# Patient Record
Sex: Female | Born: 1955 | Race: White | Hispanic: No | Smoking: Never smoker
Health system: Southern US, Community
[De-identification: ages and names within clinical notes are randomized; demographics above are authoritative.]

## PROBLEM LIST (undated history)

## (undated) DIAGNOSIS — E7800 Pure hypercholesterolemia, unspecified: Secondary | ICD-10-CM

## (undated) HISTORY — PX: BREAST ENHANCEMENT SURGERY: SHX7

## (undated) HISTORY — PX: TUBAL LIGATION: SHX77

---

## 2014-12-30 ENCOUNTER — Encounter (HOSPITAL_COMMUNITY): Payer: Self-pay | Admitting: Emergency Medicine

## 2014-12-30 ENCOUNTER — Emergency Department (HOSPITAL_COMMUNITY): Payer: BLUE CROSS/BLUE SHIELD

## 2014-12-30 ENCOUNTER — Emergency Department (HOSPITAL_COMMUNITY)
Admission: EM | Admit: 2014-12-30 | Discharge: 2014-12-31 | Disposition: A | Discharge status: Home / Self Care | Payer: BLUE CROSS/BLUE SHIELD | Attending: Emergency Medicine | Admitting: Emergency Medicine

## 2014-12-30 DIAGNOSIS — R079 Chest pain, unspecified: Secondary | ICD-10-CM | POA: Diagnosis present

## 2014-12-30 DIAGNOSIS — Z8639 Personal history of other endocrine, nutritional and metabolic disease: Secondary | ICD-10-CM | POA: Diagnosis not present

## 2014-12-30 HISTORY — DX: Pure hypercholesterolemia, unspecified: E78.00

## 2014-12-30 LAB — CBC WITH DIFFERENTIAL/PLATELET
BASOS PCT: 0 % (ref 0–1)
Basophils Absolute: 0 10*3/uL (ref 0.0–0.1)
EOS PCT: 2 % (ref 0–5)
Eosinophils Absolute: 0.1 10*3/uL (ref 0.0–0.7)
HEMATOCRIT: 43.1 % (ref 36.0–46.0)
HEMOGLOBIN: 14.1 g/dL (ref 12.0–15.0)
LYMPHS PCT: 30 % (ref 12–46)
Lymphs Abs: 1.7 10*3/uL (ref 0.7–4.0)
MCH: 28.7 pg (ref 26.0–34.0)
MCHC: 32.7 g/dL (ref 30.0–36.0)
MCV: 87.8 fL (ref 78.0–100.0)
Monocytes Absolute: 0.5 10*3/uL (ref 0.1–1.0)
Monocytes Relative: 9 % (ref 3–12)
Neutro Abs: 3.3 10*3/uL (ref 1.7–7.7)
Neutrophils Relative %: 59 % (ref 43–77)
Platelets: 339 10*3/uL (ref 150–400)
RBC: 4.91 MIL/uL (ref 3.87–5.11)
RDW: 12.8 % (ref 11.5–15.5)
WBC: 5.6 10*3/uL (ref 4.0–10.5)

## 2014-12-30 LAB — BASIC METABOLIC PANEL
Anion gap: 10 (ref 5–15)
BUN: 13 mg/dL (ref 6–23)
CHLORIDE: 101 mmol/L (ref 96–112)
CO2: 27 mmol/L (ref 19–32)
Calcium: 9.4 mg/dL (ref 8.4–10.5)
Creatinine, Ser: 0.84 mg/dL (ref 0.50–1.10)
GFR, EST AFRICAN AMERICAN: 88 mL/min — AB (ref >90)
GFR, EST NON AFRICAN AMERICAN: 76 mL/min — AB (ref >90)
Glucose, Bld: 122 mg/dL — ABNORMAL HIGH (ref 70–99)
POTASSIUM: 3.8 mmol/L (ref 3.5–5.1)
Sodium: 138 mmol/L (ref 135–145)

## 2014-12-30 LAB — BRAIN NATRIURETIC PEPTIDE: B Natriuretic Peptide: 9 pg/mL (ref 0.0–100.0)

## 2014-12-30 LAB — I-STAT TROPONIN, ED
TROPONIN I, POC: 0 ng/mL (ref 0.00–0.08)
TROPONIN I, POC: 0 ng/mL (ref 0.00–0.08)

## 2014-12-30 NOTE — ED Notes (Signed)
[facility] EMS[facility] and SOB; pt denies pain at present; pt sts started 3 days ago; pt given 81mg  ASA; IV 18 L AC

## 2014-12-31 NOTE — ED Provider Notes (Signed)
CSN: [id number]     Arrival date & time 12/30/14  1701 History   First MD Initiated Contact with Patient 12/30/14 2211     Chief Complaint  Patient presents with  . Chest Pain     (Consider location/radiation/quality/duration/timing/severity/associated sxs/prior Treatment) Patient is a 59 y.o. female presenting with chest pain. The history is provided by the patient.  Chest Pain Pain location:  Substernal area Pain quality: pressure   Pain radiates to:  Does not radiate Pain radiates to the back: no   Pain severity:  Mild Onset quality:  Gradual Duration:  3 days Timing:  Intermittent Progression:  Waxing and waning Chronicity:  New Context: not breathing, no intercourse, not raising an arm, no stress and no trauma   Relieved by:  None tried Worsened by:  Nothing tried Ineffective treatments:  None tried Associated symptoms: no abdominal pain, no anxiety, no cough, no diaphoresis, no dizziness, no fever, no headache, no nausea, no shortness of breath, not vomiting and no weakness   Risk factors: high cholesterol   Risk factors: no diabetes mellitus, no hypertension, no prior DVT/PE, no smoking and no surgery     Past Medical History  Diagnosis Date  . Hypercholesteremia    History reviewed. No pertinent past surgical history. History reviewed. No pertinent family history. History  Substance Use Topics  . Smoking status: Never Smoker   . Smokeless tobacco: Not on file  . Alcohol Use: No   OB History    No data available     Review of Systems  Constitutional: Negative for fever, chills and diaphoresis.  HENT: Negative for nosebleeds.   Eyes: Negative for visual disturbance.  Respiratory: Negative for cough and shortness of breath.   Cardiovascular: Positive for chest pain.  Gastrointestinal: Negative for nausea, vomiting, abdominal pain, diarrhea and constipation.  Genitourinary: Negative for dysuria.  Skin: Negative for rash.  Neurological: Negative for  dizziness, weakness and headaches.  All other systems reviewed and are negative.     Allergies  Erythromycin and Peach flavor  Home Medications   Prior to Admission medications   Not on File   BP 122/57 mmHg  Pulse 93  Temp(Src) 98.1 F (36.7 C) (Oral)  Resp 13  SpO2 99% Physical Exam  Constitutional: She is oriented to person, place, and time. No distress.  HENT:  Head: Normocephalic and atraumatic.  Eyes: EOM are normal. Pupils are equal, round, and reactive to light.  Neck: Normal range of motion. Neck supple.  Cardiovascular: Normal rate and intact distal pulses.   Pulmonary/Chest: No respiratory distress.  Abdominal: Soft. There is no tenderness.  Musculoskeletal: Normal range of motion.  Neurological: She is alert and oriented to person, place, and time.  Skin: No rash noted. She is not diaphoretic.  Psychiatric: She has a normal mood and affect.    ED Course  Procedures (including critical care time) Labs Review Labs Reviewed  BASIC METABOLIC PANEL - Abnormal; Notable for the following:    Glucose, Bld 122 (*)    GFR calc non Af Amer 76 (*)    GFR calc Af Amer 88 (*)    All other components within normal limits  BRAIN NATRIURETIC PEPTIDE  CBC WITH DIFFERENTIAL/PLATELET  Rosezena Sensor, ED  Rosezena Sensor, ED    Imaging Review Dg Chest 2 View  12/30/2014   CLINICAL DATA:  Initial evaluation for chest pain for 1 day  EXAM: CHEST  2 VIEW  COMPARISON:  None.  FINDINGS: The heart size and  mediastinal contours are within normal limits. Both lungs are clear. The visualized skeletal structures are unremarkable.  IMPRESSION: No active cardiopulmonary disease.   Electronically Signed   By: Esperanza Heiraymond  Rubner M.D.   On: 12/30/2014 17:40     EKG Interpretation   Date/Time:  Monday December 30 2014 17:10:29 EDT Ventricular Rate:  93 PR Interval:  124 QRS Duration: 82 QT Interval:  352 QTC Calculation: 437 R Axis:   54 Text Interpretation:  Normal sinus rhythm  Cannot rule out Anterior infarct  , age undetermined Abnormal ECG No old tracing to compare Confirmed by  CAMPOS  MD, Caryn BeeKEVIN ([id number]) on 12/30/2014 11:24:39 PM      MDM   Final diagnoses:  None    59 y/o female w/ PMH HL who comes in with three days of intermittent, mild substernal chest discomfort which she describes as "heaviness."  The pain will last for hours at at time but is very mild and she has difficulty determining when it starts/stops exactly because of how mild it is.  She notes that she has been under a lot of emotional stress recently as she recalls her husbands passing but she denies any SI.  No cough/SOB/leg swelling, no prev dvt/pe.  No previous ischemic evaluation  Exam as above, VSS, satting well on RA.  Lungs CTAB  EKG w/o ischemic changes.  Basic labs obtained and unremarkable, trop neg X2.  Patient w/ HEAR score of 3, feel that ACS is unlikely given few risk factors and undetectable trop X2, but feel that patient would benefit from provocative testing.  Given she has a HEART score of 3, feel it is appropriate for this to be done as an outpatient.  Doubt PE given no leg swelling, no prev dvt/pe.  Not tachy, satting 100% on RA.  Doubt dissection given characteristics of the pain.  I have discussed the results, Dx and Tx plan with the patient. They expressed understanding and agree with the plan and were told to return to ED with any worsening of condition or concern.    Disposition: Discharge  Condition: Good  There are no discharge medications for this patient.   [facility] [address] [id number] [place] [id number] [phone]  If symptoms worsen   Pt seen in conjunction with Dr. Janice Coffincampos     Porcia Morganti, MD 01/01/15 [id number]  Azalia BilisKevin Campos, MD 01/01/15 [phone]

## 2014-12-31 NOTE — Discharge Instructions (Signed)

## 2015-10-29 IMAGING — CR DG CHEST 2V
2 series · 2 of 2 positions shown · non-contrast
Comparison: None.

CLINICAL DATA: Initial evaluation for chest pain for 1 day

EXAM:
CHEST  2 VIEW

[w chest pa]
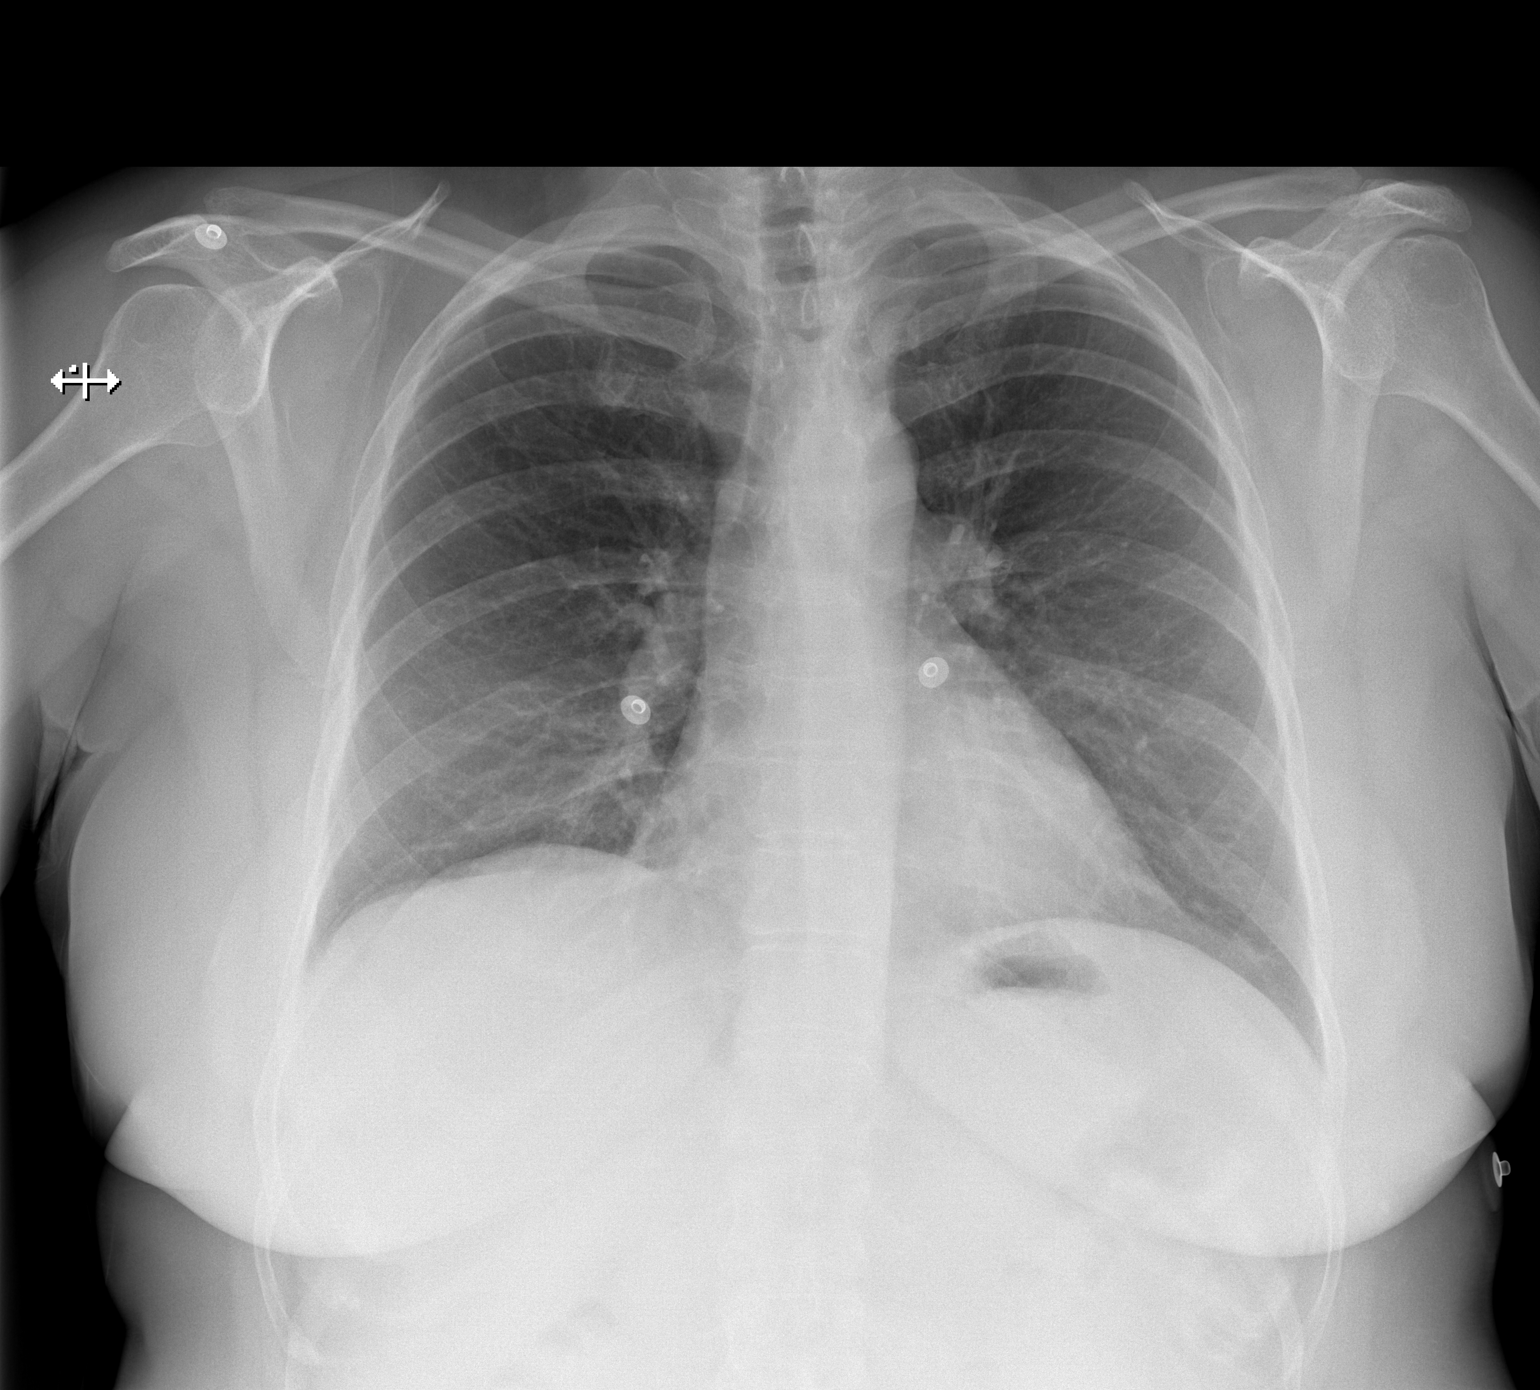

[w chest lat]
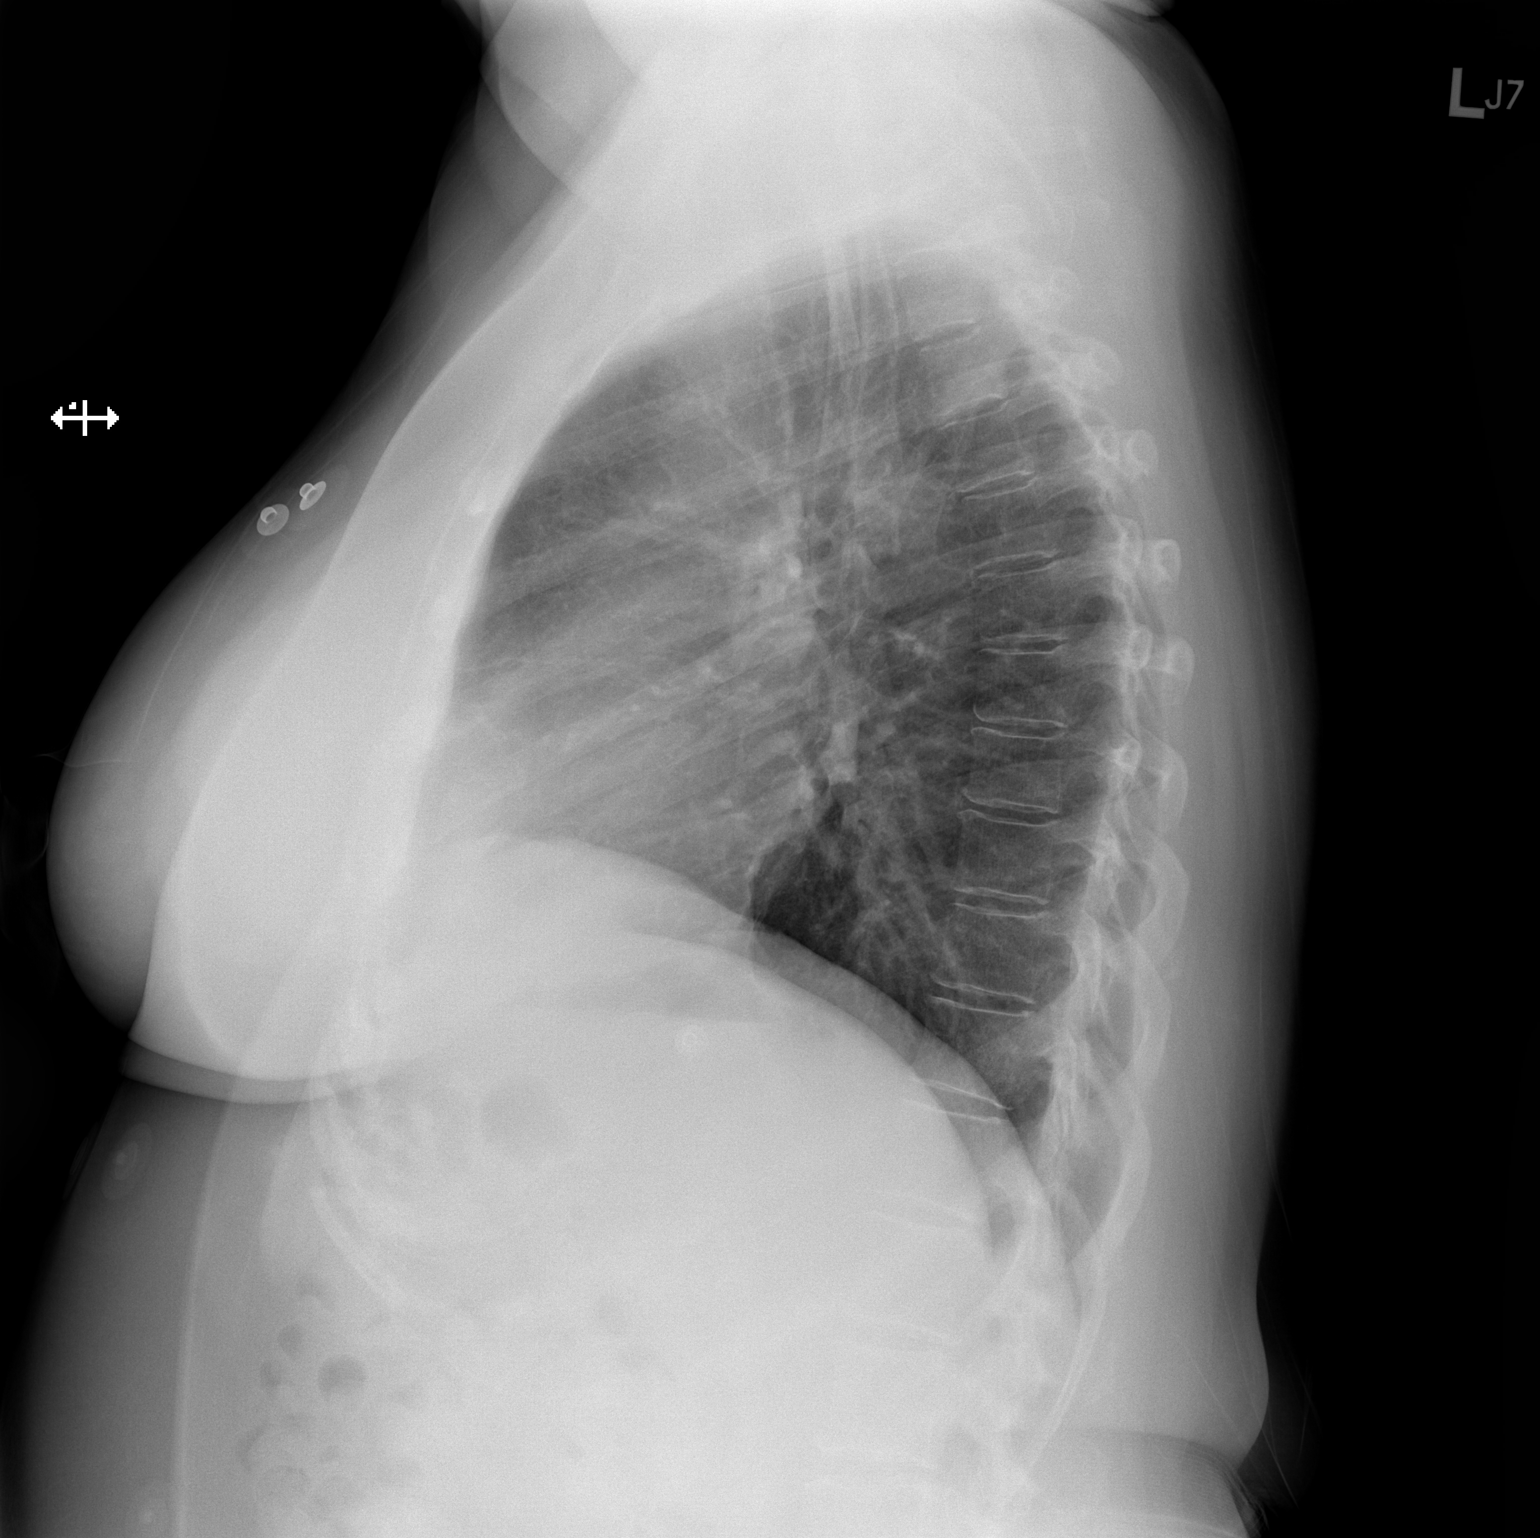

[2 of 2 positions shown; findings below may reference images not displayed]

FINDINGS: The heart size and mediastinal contours are within normal limits.
Both lungs are clear. The visualized skeletal structures are
unremarkable.
IMPRESSION: No active cardiopulmonary disease.

## 2015-12-10 ENCOUNTER — Ambulatory Visit (INDEPENDENT_AMBULATORY_CARE_PROVIDER_SITE_OTHER): Payer: BLUE CROSS/BLUE SHIELD | Admitting: Osteopathic Medicine

## 2015-12-10 ENCOUNTER — Encounter: Payer: Self-pay | Admitting: Osteopathic Medicine

## 2015-12-10 VITALS — BP 152/89 | HR 93 | Ht 61.0 in | Wt 208.0 lb

## 2015-12-10 DIAGNOSIS — IMO0001 Reserved for inherently not codable concepts without codable children: Secondary | ICD-10-CM

## 2015-12-10 DIAGNOSIS — R0789 Other chest pain: Secondary | ICD-10-CM

## 2015-12-10 DIAGNOSIS — R768 Other specified abnormal immunological findings in serum: Secondary | ICD-10-CM

## 2015-12-10 DIAGNOSIS — R030 Elevated blood-pressure reading, without diagnosis of hypertension: Secondary | ICD-10-CM

## 2015-12-10 DIAGNOSIS — R5382 Chronic fatigue, unspecified: Secondary | ICD-10-CM | POA: Insufficient documentation

## 2015-12-10 DIAGNOSIS — R21 Rash and other nonspecific skin eruption: Secondary | ICD-10-CM

## 2015-12-10 DIAGNOSIS — E559 Vitamin D deficiency, unspecified: Secondary | ICD-10-CM

## 2015-12-10 DIAGNOSIS — R894 Abnormal immunological findings in specimens from other organs, systems and tissues: Secondary | ICD-10-CM

## 2015-12-10 DIAGNOSIS — Z139 Encounter for screening, unspecified: Secondary | ICD-10-CM | POA: Diagnosis not present

## 2015-12-10 DIAGNOSIS — Z13220 Encounter for screening for lipoid disorders: Secondary | ICD-10-CM | POA: Diagnosis not present

## 2015-12-10 NOTE — Progress Notes (Signed)
HPI: Madison Sweeney is a 60 y.o. female who presents to [facility] today for chief complaint of:  Chief Complaint  Patient presents with  . Establish Care    "general not feeling well, tired, have to sit down, chest tightness usually at nigh"    CHEST PAIN  . Location: Left sternal . Quality: pressure, "like you get when you have a cold,"  . Severity: Mild . Duration: Few months, no change . Timing: Intermittent not associated with exertion, seems to wake her up mostly in the middle of the night . Context: previously w/u for chest pain last year in ER, not admitted  . Assoc signs/symptoms: no SOB/orthopnea  FATIGUE: "not feeling well, just tired" and occasional SOB during the day but she only has chest pain night, not associated with daytime shortness of breath.   RESP: (+) hx secondhand smoke exposure, no active smoker.   SKIN: Eczema, would like refill of topical steroid. Patient reports rash, worse in the summer months, located under breasts and lower abdomen   Past medical, social and family history reviewed: Past Medical History  Diagnosis Date  . Hypercholesteremia    Past Surgical History  Procedure Laterality Date  . Breast enhancement surgery    . Tubal ligation     Social History  Substance Use Topics  . Smoking status: Never Smoker   . Smokeless tobacco: Not on file  . Alcohol Use: No   Family History  Problem Relation Age of Onset  . Heart attack Mother     DECEASED  . Heart attack Maternal Grandfather   . Diabetes Father     DECEASED    Current Outpatient Prescriptions  Medication Sig Dispense Refill  . B Complex-C-E-Zn (B COMPLEX-C-E-ZINC) tablet Take 1 tablet by mouth daily.     No current facility-administered medications for this visit.   Allergies  Allergen Reactions  . Erythromycin Anaphylaxis  . Peach Flavor Rash      Review of Systems: CONSTITUTIONAL:  No  fever, no chills, No  unintentional weight  changes HEAD/EYES/EARS/NOSE/THROAT: No  headache, no vision change, no hearing change, No  sore throat, No  sinus pressure CARDIAC: (+) chest pain as per history of present illness, No  pressure, No palpitations, No  orthopnea RESPIRATORY: No  cough, (+) Asian old daytime shortness of breath GASTROINTESTINAL: No  nausea, No  vomiting, No  abdominal pain, No  blood in stool, No  diarrhea, No  constipation  MUSCULOSKELETAL: No  myalgia/arthralgia GENITOURINARY: No  incontinence, No  abnormal genital bleeding/discharge SKIN: (+)  Rash as per history of present illness, no wounds/concerning lesions HEM/ONC: No  easy bruising/bleeding, No  abnormal lymph node ENDOCRINE: No polyuria/polydipsia/polyphagia, No  heat/cold intolerance  NEUROLOGIC: (+) Generalized weakness, No  dizziness, No  slurred speech PSYCHIATRIC: No  concerns with depression, No  concerns with anxiety, No sleep problems   Exam:  BP 152/89 mmHg  Pulse 93  Ht 5' 1" (1.549 m)  Wt 208 lb (94.348 kg)  BMI 39.32 kg/m2 Constitutional: VS see above. General Appearance: alert, well-developed, well-nourished, NAD Eyes: Normal lids and conjunctive, non-icteric sclera, Ears, Nose, Mouth, Throat: MMM, Normal external inspection ears/nares/mouth/lips/gums, TM normal bilaterally. Pharynx no erythema, no exudate.  Neck: No masses, trachea midline. No thyroid enlargement/tenderness/mass appreciated. No lymphadenopathy Respiratory: Normal respiratory effort. no wheeze, no rhonchi, no rales Cardiovascular: S1/S2 normal, no murmur, no rub/gallop auscultated. RRR. No lower extremity edema. Gastrointestinal: Nontender, no masses. No hepatomegaly, no splenomegaly. No hernia  appreciated. Bowel sounds normal. Rectal exam deferred.  Musculoskeletal: Gait normal. No clubbing/cyanosis of digits.  Neurological: No cranial nerve deficit on limited exam. Motor and sensation intact and symmetric Skin: warm, dry, intact. No rash/ulcer. No concerning nevi or  subq nodules on limited exam.   Psychiatric: Normal judgment/insight. Normal mood and affect. Oriented x3.   EKG interpretation: Rate: 82  Rhythm: sinus No ST/T changes concerning for acute ischemia/infarct  Inverted T waves V1    Results for orders placed or performed in visit on 12/10/15 (from the past 72 hour(s))  CBC with Differential/Platelet     Status: None   Collection Time: 12/10/15  8:27 AM  Result Value Ref Range   WBC 4.1 4.0 - 10.5 K/uL   RBC 4.93 3.87 - 5.11 MIL/uL   Hemoglobin 14.2 12.0 - 15.0 g/dL   HCT 43.0 36.0 - 46.0 %   MCV 87.2 78.0 - 100.0 fL   MCH 28.8 26.0 - 34.0 pg   MCHC 33.0 30.0 - 36.0 g/dL   RDW 13.5 11.5 - 15.5 %   Platelets 331 150 - 400 K/uL   MPV 10.8 8.6 - 12.4 fL   Neutrophils Relative % 58 43 - 77 %   Neutro Abs 2.4 1.7 - 7.7 K/uL   Lymphocytes Relative 28 12 - 46 %   Lymphs Abs 1.1 0.7 - 4.0 K/uL   Monocytes Relative 12 3 - 12 %   Monocytes Absolute 0.5 0.1 - 1.0 K/uL   Eosinophils Relative 2 0 - 5 %   Eosinophils Absolute 0.1 0.0 - 0.7 K/uL   Basophils Relative 0 0 - 1 %   Basophils Absolute 0.0 0.0 - 0.1 K/uL   Smear Review Criteria for review not met   COMPLETE METABOLIC PANEL WITH GFR     Status: None   Collection Time: 12/10/15  8:27 AM  Result Value Ref Range   Sodium 139 135 - 146 mmol/L   Potassium 4.4 3.5 - 5.3 mmol/L   Chloride 104 98 - 110 mmol/L   CO2 26 20 - 31 mmol/L   Glucose, Bld 97 65 - 99 mg/dL   BUN 16 7 - 25 mg/dL   Creat 0.81 0.50 - 1.05 mg/dL   Total Bilirubin 0.5 0.2 - 1.2 mg/dL   Alkaline Phosphatase 63 33 - 130 U/L   AST 16 10 - 35 U/L   ALT 14 6 - 29 U/L   Total Protein 7.1 6.1 - 8.1 g/dL   Albumin 4.3 3.6 - 5.1 g/dL   Calcium 9.4 8.6 - 10.4 mg/dL   GFR, Est African American >89 >=60 mL/min   GFR, Est Non African American 81 >=60 mL/min    Comment:   The estimated GFR is a calculation valid for adults (>=2 years old) that uses the CKD-EPI algorithm to adjust for age and sex. It is   not to be  used for children, pregnant women, hospitalized patients,    patients on dialysis, or with rapidly changing kidney function. According to the NKDEP, eGFR >89 is normal, 60-89 shows mild impairment, 30-59 shows moderate impairment, 15-29 shows severe impairment and <15 is ESRD.     HIV antibody     Status: None   Collection Time: 12/10/15  8:27 AM  Result Value Ref Range   HIV 1&2 Ab, 4th Generation NONREACTIVE NONREACTIVE    Comment:   HIV-1 antigen and HIV-1/HIV-2 antibodies were not detected.  There is no laboratory evidence of HIV infection.   HIV-1/2  Antibody Diff        Not indicated. HIV-1 RNA, Qual TMA          Not indicated.     PLEASE NOTE: This information has been disclosed to you from records whose confidentiality may be protected by state law. If your state requires such protection, then the state law prohibits you from making any further disclosure of the information without the specific written consent of the person to whom it pertains, or as otherwise permitted by law. A general authorization for the release of medical or other information is NOT sufficient for this purpose.   The performance of this assay has not been clinically validated in patients less than 57 years old.   For additional information please refer to http://education.questdiagnostics.com/faq/FAQ106.  (This link is being provided for informational/educational purposes only.)     TSH     Status: None   Collection Time: 12/10/15  8:27 AM  Result Value Ref Range   TSH 4.35 mIU/L    Comment:   Reference Range   > or = 20 Years  0.40-4.50   Pregnancy Range First trimester  0.26-2.66 Second trimester 0.55-2.73 Third trimester  0.43-2.91   ** Please note change in unit of measure and reference range(s). **     VITAMIN D 25 Hydroxy (Vit-D Deficiency, Fractures)     Status: Abnormal   Collection Time: 12/10/15  8:27 AM  Result Value Ref Range   Vit D, 25-Hydroxy 10 (L) 30 - 100 ng/mL     Comment: Vitamin D Status           25-OH Vitamin D        Deficiency                <20 ng/mL        Insufficiency         20 - 29 ng/mL        Optimal             > or = 30 ng/mL   For 25-OH Vitamin D testing on patients on D2-supplementation and patients for whom quantitation of D2 and D3 fractions is required, the [facility] 25-OH VIT D, (D2,D3), LC/MS/MS is recommended: order code [phone] (patients > 2 yrs).   Lipid panel     Status: Abnormal   Collection Time: 12/10/15  8:27 AM  Result Value Ref Range   Cholesterol 264 (H) 125 - 200 mg/dL   Triglycerides 224 (H) <150 mg/dL   HDL 50 >=46 mg/dL   Total CHOL/HDL Ratio 5.3 (H) <=5.0 Ratio   VLDL 45 (H) <30 mg/dL   LDL Cholesterol 169 (H) <130 mg/dL    Comment:   Total Cholesterol/HDL Ratio:CHD Risk                        Coronary Heart Disease Risk Table                                        Men       Women          1/2 Average Risk              3.4        3.3              Average Risk  5.0        4.4           2X Average Risk              9.6        7.1           3X Average Risk             23.4       11.0 Use the calculated Patient Ratio above and the CHD Risk table  to determine the patient's CHD Risk.   Hepatitis C antibody     Status: Abnormal   Collection Time: 12/10/15  8:27 AM  Result Value Ref Range   HCV Ab REACTIVE (A) NEGATIVE  Hepatitis C RNA quantitative     Status: None (Preliminary result)   Collection Time: 12/10/15  8:27 AM  Result Value Ref Range   HCV Quantitative  <15 IU/mL   HCV Quantitative Log  <1.18 log 10    Comment:   This test utilizes the [place] FDA approved Roche HCV Test Kit by RT-PCR.       ASSESSMENT/PLAN:   Chronic fatigue - Poor diet, very little exercise, pt trying to improve diet, (+)FH thyroid problems, labs as below - Plan: CBC with Differential/Platelet, COMPLETE METABOLIC PANEL WITH GFR, TSH, VITAMIN D 25 Hydroxy (Vit-D Deficiency, Fractures) - extensively counseled  on diet/exercise changes to improve overall health and reduce cardiovascular risk  Chest discomfort - usually at night, "pressure" feeling, no SOB/diaphoresis, no CP on exertion. EKG today. Previous notes indicate nonspecifc abn EKG.  - Plan: EKG 12-Lead. No exertional symptoms, will get labs, possible consideration for sleep apnea, we'll pursue this further at next visit, ER precautions reviewed. Blood pressure elevated, patient states has whitecoat hypertension. On record review, blood pressure 140/90 12/30/2014, 143/87 on 12/01/2013  Lipid screening - Plan: Lipid panel  Screening - Plan: HIV antibody, Hepatitis C antibody  Rash and nonspecific skin eruption - Within skin folds, unlikely eczema, pt states worse in summer, would probably not advise steroids for this, consider nystatin powder, we'll call this in to use as needed  Elevated blood pressure - ?white coat syndrome - pt to bring home monitor to confirm.   Hepatitis C antibody positive awaiting confirmatory lab   Hypovitaminosis D  Return in about 1 week (around 12/17/2015), or sooner if needed, for [place].

## 2015-12-10 NOTE — Patient Instructions (Signed)
Please come back to get fasting lab work done for accurate cholesterol measurements and sugar measurements. We are getting other tests for routine preventive care screening as well as work-up for chronic fatigue. You can expect a call to inform you of results, and we can discuss results in detail at your next office visit.   See below for heart-healthy eating plan. If you have a smartphone, there are several apps out there which help you track your calories and will measure your daily intake of fat versus carbs versus protein. My Fitness Pal app will even scan bar codes on foods and has information on [employer] nutrition information. Quit McDonald's!!!!   If chest pain is worse, is not going away, is accompanied by palpitations or sweating, or severe dizziness, or if you have any other concerns - GO TO EMERGENCY ROOM!    Heart-Healthy Eating Plan Many factors influence your heart health, including eating and exercise habits. Heart (coronary) risk increases with abnormal blood fat (lipid) levels. Heart-healthy meal planning includes limiting unhealthy fats, increasing healthy fats, and making other small dietary changes. This includes maintaining a healthy body weight to help keep lipid levels within a normal range. WHAT IS MY PLAN?  Your health care provider recommends that you:  Get no more than _____30____% of the total calories in your daily diet from fat.  Limit your intake of saturated fat to less than ____16_____ grams each day.  Limit the amount of cholesterol in your diet to less than ___200-300____ mg per day. WHAT TYPES OF FAT SHOULD I CHOOSE?  Choose healthy fats more often. Choose monounsaturated and polyunsaturated fats, such as olive oil and canola oil, flaxseeds, walnuts, almonds, and seeds.  Eat more omega-3 fats. Good choices include salmon, mackerel, sardines, tuna, flaxseed oil, and ground flaxseeds. Aim to eat fish at least two times each week.  Limit saturated  fats. Saturated fats are primarily found in animal products, such as meats, butter, and cream. Plant sources of saturated fats include palm oil, palm kernel oil, and coconut oil.  Avoid foods with partially hydrogenated oils in them. These contain trans fats. Examples of foods that contain trans fats are stick margarine, some tub margarines, cookies, crackers, and other baked goods. WHAT GENERAL GUIDELINES DO I NEED TO FOLLOW?  Check food labels carefully to identify foods with trans fats or high amounts of saturated fat.  Fill one half of your plate with vegetables and green salads. Eat 4-5 servings of vegetables per day. A serving of vegetables equals 1 cup of raw leafy vegetables,  cup of raw or cooked cut-up vegetables, or  cup of vegetable juice.  Fill one fourth of your plate with whole grains. Look for the word "whole" as the first word in the ingredient list.  Fill one fourth of your plate with lean protein foods.  Eat 4-5 servings of fruit per day. A serving of fruit equals one medium whole fruit,  cup of dried fruit,  cup of fresh, frozen, or canned fruit, or  cup of 100% fruit juice.  Eat more foods that contain soluble fiber. Examples of foods that contain this type of fiber are apples, broccoli, carrots, beans, peas, and barley. Aim to get 20-30 g of fiber per day.  Eat more home-cooked food and less restaurant, buffet, and fast food.  Limit or avoid alcohol.  Limit foods that are high in starch and sugar.  Avoid fried foods.  Cook foods by using methods other than frying. Baking,  boiling, grilling, and broiling are all great options. Other fat-reducing suggestions include:  Removing the skin from poultry.  Removing all visible fats from meats.  Skimming the fat off of stews, soups, and gravies before serving them.  Steaming vegetables in water or broth.  Lose weight if you are overweight. Losing just 5-10% of your initial body weight can help your overall health  and prevent diseases such as diabetes and heart disease.  Increase your consumption of nuts, legumes, and seeds to 4-5 servings per week. One serving of dried beans or legumes equals  cup after being cooked, one serving of nuts equals 1 ounces, and one serving of seeds equals  ounce or 1 tablespoon.  You may need to monitor your salt (sodium) intake, especially if you have high blood pressure. Talk with your health care provider or dietitian to get more information about reducing sodium. WHAT FOODS CAN I EAT? Grains Breads, including [place], white, pita, wheat, raisin, rye, oatmeal, and [place]. Tortillas that are neither fried nor made with lard or trans fat. Low-fat rolls, including hotdog and hamburger buns and English muffins. Biscuits. Muffins. Waffles. Pancakes. Light popcorn. Whole-grain cereals. Flatbread. Melba toast. Pretzels. Breadsticks. Rusks. Low-fat snacks and crackers, including oyster, saltine, matzo, graham, animal, and rye. Rice and pasta, including brown rice and those that are made with whole wheat. Vegetables All vegetables. Fruits All fruits, but limit coconut. Meats and Other Protein Sources Lean, well-trimmed beef, veal, pork, and lamb. Chicken and [place] without skin. All fish and shellfish. Wild duck, rabbit, pheasant, and venison. Egg whites or low-cholesterol egg substitutes. Dried beans, peas, lentils, and tofu.Seeds and most nuts. Dairy Low-fat or nonfat cheeses, including ricotta, string, and mozzarella. Skim or 1% milk that is liquid, powdered, or evaporated. Buttermilk that is made with low-fat milk. Nonfat or low-fat yogurt. Beverages Mineral water. Diet carbonated beverages. Sweets and Desserts Sherbets and fruit ices. Honey, jam, marmalade, jelly, and syrups. Meringues and gelatins. Pure sugar candy, such as hard candy, jelly beans, gumdrops, mints, marshmallows, and small amounts of dark chocolate. [employer]. Eat all sweets and desserts in  moderation. Fats and Oils Nonhydrogenated (trans-free) margarines. Vegetable oils, including soybean, sesame, sunflower, olive, peanut, safflower, corn, canola, and cottonseed. Salad dressings or mayonnaise that are made with a vegetable oil. Limit added fats and oils that you use for cooking, baking, salads, and as spreads. Other Cocoa powder. Coffee and tea. All seasonings and condiments. The items listed above may not be a complete list of recommended foods or beverages. Contact your dietitian for more options. WHAT FOODS ARE NOT RECOMMENDED? Grains Breads that are made with saturated or trans fats, oils, or whole milk. Croissants. Butter rolls. Cheese breads. Sweet rolls. Donuts. Buttered popcorn. Chow mein noodles. High-fat crackers, such as cheese or butter crackers. Meats and Other Protein Sources Fatty meats, such as hotdogs, short ribs, sausage, spareribs, bacon, ribeye roast or steak, and mutton. High-fat deli meats, such as salami and bologna. Caviar. Domestic duck and goose. Organ meats, such as kidney, liver, sweetbreads, brains, gizzard, chitterlings, and heart. Dairy Cream, sour cream, cream cheese, and creamed cottage cheese. Whole milk cheeses, including blue (bleu), [address], [place], [place], [address], [place], [address], [place], [place], and [place]. Whole or 2% milk that is liquid, evaporated, or condensed. Whole buttermilk. Cream sauce or high-fat cheese sauce. Yogurt that is made from whole milk. Beverages Regular sodas and drinks with added sugar. Sweets and Desserts Frosting. Pudding. Cookies. Cakes other than angel food cake. Candy that  has milk chocolate or white chocolate, hydrogenated fat, butter, coconut, or unknown ingredients. Buttered syrups. Full-fat ice cream or ice cream drinks. Fats and Oils Gravy that has suet, meat fat, or shortening. Cocoa butter, hydrogenated oils, palm oil, coconut oil, palm kernel oil. These can often be found in baked products, candy,  fried foods, nondairy creamers, and whipped toppings. Solid fats and shortenings, including bacon fat, salt pork, lard, and butter. Nondairy cream substitutes, such as coffee creamers and sour cream substitutes. Salad dressings that are made of unknown oils, cheese, or sour cream. The items listed above may not be a complete list of foods and beverages to avoid. Contact your dietitian for more information.   This information is not intended to replace advice given to you by your health care provider. Make sure you discuss any questions you have with your health care provider.   Document Released: 07/13/2008 Document Revised: 10/25/2014 Document Reviewed: 03/28/2014 Elsevier Interactive Patient Education [employer]

## 2015-12-11 LAB — COMPLETE METABOLIC PANEL WITH GFR
ALBUMIN: 4.3 g/dL (ref 3.6–5.1)
ALT: 14 U/L (ref 6–29)
AST: 16 U/L (ref 10–35)
Alkaline Phosphatase: 63 U/L (ref 33–130)
BUN: 16 mg/dL (ref 7–25)
CHLORIDE: 104 mmol/L (ref 98–110)
CO2: 26 mmol/L (ref 20–31)
CREATININE: 0.81 mg/dL (ref 0.50–1.05)
Calcium: 9.4 mg/dL (ref 8.6–10.4)
GFR, Est Non African American: 81 mL/min (ref >=60)
Glucose, Bld: 97 mg/dL (ref 65–99)
Potassium: 4.4 mmol/L (ref 3.5–5.3)
Sodium: 139 mmol/L (ref 135–146)
Total Bilirubin: 0.5 mg/dL (ref 0.2–1.2)
Total Protein: 7.1 g/dL (ref 6.1–8.1)

## 2015-12-11 LAB — CBC WITH DIFFERENTIAL/PLATELET
Basophils Absolute: 0 10*3/uL (ref 0.0–0.1)
Basophils Relative: 0 % (ref 0–1)
EOS PCT: 2 % (ref 0–5)
Eosinophils Absolute: 0.1 10*3/uL (ref 0.0–0.7)
HEMATOCRIT: 43 % (ref 36.0–46.0)
HEMOGLOBIN: 14.2 g/dL (ref 12.0–15.0)
LYMPHS ABS: 1.1 10*3/uL (ref 0.7–4.0)
LYMPHS PCT: 28 % (ref 12–46)
MCH: 28.8 pg (ref 26.0–34.0)
MCHC: 33 g/dL (ref 30.0–36.0)
MCV: 87.2 fL (ref 78.0–100.0)
MPV: 10.8 fL (ref 8.6–12.4)
Monocytes Absolute: 0.5 10*3/uL (ref 0.1–1.0)
Monocytes Relative: 12 % (ref 3–12)
NEUTROS ABS: 2.4 10*3/uL (ref 1.7–7.7)
NEUTROS PCT: 58 % (ref 43–77)
Platelets: 331 10*3/uL (ref 150–400)
RBC: 4.93 MIL/uL (ref 3.87–5.11)
RDW: 13.5 % (ref 11.5–15.5)
WBC: 4.1 10*3/uL (ref 4.0–10.5)

## 2015-12-11 LAB — LIPID PANEL
Cholesterol: 264 mg/dL — ABNORMAL HIGH (ref 125–200)
HDL: 50 mg/dL (ref >=46)
LDL CALC: 169 mg/dL — AB (ref <130)
TRIGLYCERIDES: 224 mg/dL — AB (ref <150)
Total CHOL/HDL Ratio: 5.3 Ratio — ABNORMAL HIGH (ref <=5.0)
VLDL: 45 mg/dL — AB (ref <30)

## 2015-12-11 LAB — TSH: TSH: 4.35 mIU/L

## 2015-12-11 LAB — HIV ANTIBODY (ROUTINE TESTING W REFLEX): HIV 1&2 Ab, 4th Generation: NONREACTIVE

## 2015-12-12 LAB — HEPATITIS C ANTIBODY: HCV Ab: REACTIVE — AB

## 2015-12-12 LAB — VITAMIN D 25 HYDROXY (VIT D DEFICIENCY, FRACTURES): Vit D, 25-Hydroxy: 10 ng/mL — ABNORMAL LOW (ref 30–100)

## 2015-12-15 LAB — HEPATITIS C RNA QUANTITATIVE: HCV Quantitative: NOT DETECTED IU/mL (ref <15)

## 2015-12-19 ENCOUNTER — Other Ambulatory Visit: Payer: Self-pay

## 2015-12-19 MED ORDER — VITAMIN D3 1.25 MG (50000 UT) PO TABS: 1.0000 | ORAL_TABLET | ORAL | Status: DC

## 2015-12-22 ENCOUNTER — Encounter: Payer: Self-pay | Admitting: Osteopathic Medicine

## 2015-12-22 ENCOUNTER — Ambulatory Visit (INDEPENDENT_AMBULATORY_CARE_PROVIDER_SITE_OTHER): Payer: BLUE CROSS/BLUE SHIELD | Admitting: Osteopathic Medicine

## 2015-12-22 VITALS — BP 126/79 | HR 104 | Ht 61.0 in | Wt 206.0 lb

## 2015-12-22 DIAGNOSIS — R894 Abnormal immunological findings in specimens from other organs, systems and tissues: Secondary | ICD-10-CM | POA: Diagnosis not present

## 2015-12-22 DIAGNOSIS — IMO0001 Reserved for inherently not codable concepts without codable children: Secondary | ICD-10-CM

## 2015-12-22 DIAGNOSIS — R21 Rash and other nonspecific skin eruption: Secondary | ICD-10-CM | POA: Diagnosis not present

## 2015-12-22 DIAGNOSIS — R030 Elevated blood-pressure reading, without diagnosis of hypertension: Secondary | ICD-10-CM

## 2015-12-22 DIAGNOSIS — E559 Vitamin D deficiency, unspecified: Secondary | ICD-10-CM

## 2015-12-22 DIAGNOSIS — Z2821 Immunization not carried out because of patient refusal: Secondary | ICD-10-CM

## 2015-12-22 DIAGNOSIS — R768 Other specified abnormal immunological findings in serum: Secondary | ICD-10-CM

## 2015-12-22 DIAGNOSIS — E785 Hyperlipidemia, unspecified: Secondary | ICD-10-CM

## 2015-12-22 DIAGNOSIS — Z0000 Encounter for general adult medical examination without abnormal findings: Secondary | ICD-10-CM

## 2015-12-22 DIAGNOSIS — L309 Dermatitis, unspecified: Secondary | ICD-10-CM

## 2015-12-22 MED ORDER — HYDROCORTISONE 2.5 % EX OINT: TOPICAL_OINTMENT | CUTANEOUS | Status: AC

## 2015-12-22 MED ORDER — NYSTATIN POWD
Status: DC
Start: 2015-12-22 — End: 2016-01-02

## 2015-12-22 NOTE — Progress Notes (Signed)
HPI: Madison Sweeney is a 60 y.o. female who [facility] today for chief complaint of:  Chief Complaint  Patient presents with  . Annual Exam    See below for preventive care, patient has no complaints at this time  Labs were reviewed with the patient in detail, all questions answered. Normal CBC, CMP, and thyroid. Cholesterol elevated that ASCVD calculated 10 year risk not in statin benefit group. Diet and exercise advised. Vitamin D low. HIV and hepatitis C negative, patient is a false positive test her for hepatitis C confirmatory tests are negative, patient states that she murmurs having this done before and thinks had similar results then, she does have history of mono    Past medical, social and family history reviewed: Past Medical History  Diagnosis Date  . Hypercholesteremia    Past Surgical History  Procedure Laterality Date  . Breast enhancement surgery    . Tubal ligation     Social History  Substance Use Topics  . Smoking status: Never Smoker   . Smokeless tobacco: Not on file  . Alcohol Use: No   Family History  Problem Relation Age of Onset  . Heart attack Mother     DECEASED  . Heart attack Maternal Grandfather   . Diabetes Father     DECEASED    Current Outpatient Prescriptions  Medication Sig Dispense Refill  . B Complex-C-E-Zn (B COMPLEX-C-E-ZINC) tablet Take 1 tablet by mouth daily. Reported on 12/22/2015    . Cholecalciferol (VITAMIN D3) 50000 units TABS Take 1 tablet by mouth every 7 (seven) days. (Patient not taking: Reported on 12/22/2015) 8 tablet 0   No current facility-administered medications for this visit.   Allergies  Allergen Reactions  . Erythromycin Anaphylaxis  . Peach Flavor Rash      Review of Systems: CONSTITUTIONAL:  No  fever, no chills, No  unintentional weight changes HEAD/EYES/EARS/NOSE/THROAT: No  headache, no vision change, no hearing change, No  sore throat, No  sinus  pressure CARDIAC: No  chest pain, No  pressure, No palpitations, No  orthopnea RESPIRATORY: No  cough, No  shortness of breath/wheeze GASTROINTESTINAL: No  nausea, No  vomiting, No  abdominal pain, No  blood in stool, No  diarrhea, No  constipation  MUSCULOSKELETAL: No  myalgia/arthralgia GENITOURINARY: No  incontinence, No  abnormal genital bleeding/discharge SKIN: No  rash/wounds/concerning lesions HEM/ONC: No  easy bruising/bleeding, No  abnormal lymph node ENDOCRINE: No polyuria/polydipsia/polyphagia, No  heat/cold intolerance  NEUROLOGIC: No  weakness, No  dizziness, No  slurred speech PSYCHIATRIC: No  concerns with depression, No  concerns with anxiety, No sleep problems  Exam:  BP 126/79 mmHg  Pulse 104  Ht 5\' 1"  (1.549 m)  Wt 206 lb (93.441 kg)  BMI 38.94 kg/m2 Constitutional: VS see above. General Appearance: alert, well-developed, well-nourished, NAD Eyes: Normal lids and conjunctive, non-icteric sclera,  Ears, Nose, Mouth, Throat: MMM, Normal external inspection ears/nares/mouth/lips/gums Neck: No masses, trachea midline. No thyroid enlargement/tenderness/mass appreciated. No lymphadenopathy Respiratory: Normal respiratory effort. no wheeze, no rhonchi, no rales Cardiovascular: S1/S2 normal, no murmur, no rub/gallop auscultated. RRR. No lower extremity edema. Gastrointestinal: Nontender, no masses. Musculoskeletal: Gait normal. No clubbing/cyanosis of digits.  Neurological: No cranial nerve deficit on limited exam.  Skin: warm, dry, intact. No rash/ulcer. No concerning nevi or subq nodules on limited exam.   Psychiatric: Normal judgment/insight. Normal mood and affect. Oriented x3.    No results found for this or any previous visit (from  the past 72 hour(s)).    ASSESSMENT/PLAN: Preventive care concerns to address - declines cervical cancer screening at this time, declines mammogram, states that she washed her husband go through cancer treatment and she is not interested  in pursuing any cancer screening. She also notes that she has breast implants. I advised the breast implants do not alter the screening recommendations for breast cancer, and many breast cancers can be cured with lumpectomy, patient aware of risks versus benefit of declining breast cancer screening. Regarding colon cancer screening, patient states her last colonoscopy was in 2012, can't remember specific instructions regarding follow-up, would consider: Guarded but not interested at this time, advised patient to get records from her colonoscopy/call her GI to determine follow-up with regard to colon cancer screening. All vaccinations were declined by the patient including influenza, tetanus, shingles. She states she thing she may have had the shingles vaccine in the past, I do not have records available for this.   Annual physical exam  Rash and nonspecific skin eruption - Plan: Nystatin POWD  Eczema - Plan: hydrocortisone 2.5 % ointment  Hepatitis C antibody test positive - Confirmatory testing negative, patient has history of previous antibody test positive but confirmatory test negative as well  Hypovitaminosis D  Hyperlipidemia - Not in statin benefit group based on other risk factors, repeat lipid screening annually/as needed  Influenza vaccination declined   FEMALE PREVENTIVE CARE  ANNUAL SCREENING/COUNSELING Tobacco - Never  Alcohol - social drinker Diet/Exercise - HEALTHY HABITS DISCUSSED TO DECREASE CV RISK Sexual Health - No STI - The patient denies history of sexually transmitted disease. INTERESTED IN STI TESTING - no Depression - PQH2 Negative Domestic violence concerns - no HTN SCREENING - SEE VITALS Vaccination status - SEE BELOW  INFECTIOUS DISEASE SCREENING HIV - all adults 15-65 - does not need GC/CT - sexually active - does not need HepC - born 81-1963 - does not need TB - if risk/required by employer - does not need  DISEASE SCREENING Lipid - (Low risk  screen M35/F45; High risk screen M25/F35 if HTN, Tob, FH CHD M<55/F<65) - does not need DM2 (45+ or Risk = FH 1st deg DM, Hx GDM, overweight/sedentary, high-risk ethnicity, HTN) - does not need Osteoporosis - age 18+ or one sooner if risk - does not need  CANCER SCREENING Cervical - Pap q3 yr age 59+, Pap + HPV q5y age 18+ - PAP - 5 years ago  Breast - Mammo age 44+ (C) and biennial age 33-75 (A) - MAMMO - has breast implants  Lung - annual low dose CT Chest age 16-75 w/ 30+ PY, current/quit past 15 years - CT - does not need Colon - age 64+ or 58 years of age prior to FH Dx - GI REFERRAL - does not need - will consider Cologuard, last colonoscopy 2012  ADULT VACCINATION Influenza - annual - was offered and declined by the patient Td booster every 10 years - was offered and declined by the patient   HPV - age <67yo - was not indicated Zoster - age 11+ - was offered and declined by the patient -  Pneumonia - age 18+ sooner if risk (DM, smoker, other) - was not indicated  OTHER Fall - exercise and Vit D age 18+ - needs Consider ASA - age 9-59    Return in about 1 year (around 12/21/2016), or sooner if needed, for [employer].

## 2015-12-22 NOTE — Patient Instructions (Signed)
Vitamin D - will repeat the labs in 2 months, if still low will repeat another 8 weeks of high-dose Vitamin D. If normal, will advise multivitamin or 1000 Units daily. Let us know the day before you come back to get the blood drawn so we can release the order for the lab.   Calcium - recommended daily dose is 1200-1500 mg daily.   Will plan on repeat cholesterol in another 6 - 12 months.   Anything else, let me know! Otherwise, see you in about a year unless you need something sooner.

## 2015-12-23 ENCOUNTER — Other Ambulatory Visit: Payer: Self-pay

## 2015-12-23 DIAGNOSIS — E559 Vitamin D deficiency, unspecified: Secondary | ICD-10-CM | POA: Insufficient documentation

## 2015-12-23 DIAGNOSIS — Z2821 Immunization not carried out because of patient refusal: Secondary | ICD-10-CM | POA: Insufficient documentation

## 2015-12-23 DIAGNOSIS — E785 Hyperlipidemia, unspecified: Secondary | ICD-10-CM | POA: Insufficient documentation

## 2015-12-23 DIAGNOSIS — R768 Other specified abnormal immunological findings in serum: Secondary | ICD-10-CM | POA: Insufficient documentation

## 2015-12-23 MED ORDER — VITAMIN D3 1.25 MG (50000 UT) PO TABS: 1.0000 | ORAL_TABLET | ORAL | Status: AC

## 2016-01-02 ENCOUNTER — Telehealth: Payer: Self-pay | Admitting: Osteopathic Medicine

## 2016-01-02 MED ORDER — CLOTRIMAZOLE-BETAMETHASONE 1-0.05 % EX CREA: TOPICAL_CREAM | CUTANEOUS | Status: AC

## 2016-01-02 NOTE — Telephone Encounter (Signed)
Lotrisone cream sent to [facility] on [address]

## 2016-01-02 NOTE — Telephone Encounter (Signed)
Pt.notified

## 2016-01-02 NOTE — Telephone Encounter (Signed)
Pt saw PCP for a rash and was given nystatin powder. Pt states this is not helping and would like a cream called in. Will route to Provider for review.

## 2016-02-12 ENCOUNTER — Other Ambulatory Visit: Payer: Self-pay | Admitting: Osteopathic Medicine

## 2016-02-26 ENCOUNTER — Telehealth: Payer: Self-pay

## 2016-02-26 DIAGNOSIS — E559 Vitamin D deficiency, unspecified: Secondary | ICD-10-CM

## 2016-02-26 NOTE — Telephone Encounter (Signed)
Patient called stated that she has completed the course of Vitamin D supplements and wanted to get levels rechecked. Order was placed and patient will come to the lab fasting. Rhonda Cunningham,CMA

## 2016-02-28 LAB — VITAMIN D 25 HYDROXY (VIT D DEFICIENCY, FRACTURES): Vit D, 25-Hydroxy: 23 ng/mL — ABNORMAL LOW (ref 30–100)

## 2016-03-02 NOTE — Telephone Encounter (Signed)
Quick Note:  Vit D level is improved but still low. I recommend you take 2000-5000 units of Vit D daily. ______

## 2016-10-28 ENCOUNTER — Encounter: Payer: Self-pay | Admitting: Osteopathic Medicine

## 2016-10-28 ENCOUNTER — Ambulatory Visit (INDEPENDENT_AMBULATORY_CARE_PROVIDER_SITE_OTHER): Payer: BLUE CROSS/BLUE SHIELD | Admitting: Osteopathic Medicine

## 2016-10-28 VITALS — BP 133/89 | HR 107 | Temp 98.7°F | Wt 202.0 lb

## 2016-10-28 DIAGNOSIS — B9789 Other viral agents as the cause of diseases classified elsewhere: Secondary | ICD-10-CM

## 2016-10-28 DIAGNOSIS — J069 Acute upper respiratory infection, unspecified: Secondary | ICD-10-CM

## 2016-10-28 DIAGNOSIS — R0789 Other chest pain: Secondary | ICD-10-CM

## 2016-10-28 DIAGNOSIS — R0600 Dyspnea, unspecified: Secondary | ICD-10-CM

## 2016-10-28 MED ORDER — FLUTICASONE-SALMETEROL 115-21 MCG/ACT IN AERO: 2.0000 | INHALATION_SPRAY | Freq: Two times a day (BID) | RESPIRATORY_TRACT | 0 refills | Status: AC

## 2016-10-28 MED ORDER — BENZONATATE 200 MG PO CAPS: 200.0000 mg | ORAL_CAPSULE | Freq: Two times a day (BID) | ORAL | 0 refills | Status: DC | PRN

## 2016-10-28 MED ORDER — METHYLPREDNISOLONE 4 MG PO TBPK: ORAL_TABLET | ORAL | 0 refills | Status: DC

## 2016-10-28 NOTE — Progress Notes (Signed)
HPI: Madison Sweeney is a 61 y.o. female who [facility]28[facility] for chief complaint of:  Chief Complaint  Patient presents with  . URI  . Shortness of Breath    Acute Illness: . Context: thinks may have been the flu initially, Quite sick over the weekend but is feeling a bit better now would like to know if she is able to go back to work  . Quality: headache initially, now feeling better but has a bit of a cough  Is persistent . Assoc signs/symptoms: see ROS - has been having some dyspnea and occasional chest pain off and on for several months  . Duration: 5-6 days . Modifying factors: has tried the following OTC/Rx medications: has tried dimetapp and felt a bit better, no OTC meds were helping the headache but that seems to have resolves    Past medical, social and family history reviewed. Current medications and allergies reviewed.     Review of Systems:  Constitutional: initially had fever/chills  HEENT: initially had headache, Yes  sore throat, No  swollen glands  Cardiovascular: No chest pain at this time, see HPI  Respiratory:Yes  cough, No  shortness of breath  Gastrointestinal: No  nausea, No  vomiting,  No  diarrhea  Musculoskeletal:   No  myalgia/arthralgia  Skin/Integument:  No  rash   Exam:  BP 133/89   Pulse (!) 107   Temp 98.7 F (37.1 C) (Oral)   Wt 202 lb (91.6 kg)   SpO2 95%   BMI 38.17 kg/m   Constitutional: VSS, see above. General Appearance: alert, well-developed, well-nourished, NAD  Eyes: Normal lids and conjunctive, non-icteric sclera, PERRLA  Ears, Nose, Mouth, Throat: Normal external inspection ears/nares/mouth/lips/gums, normal TM, MMM; posterior pharynx without erythema, without exudate, nasal mucosa normal  Neck: No masses, trachea midline. normal lymph nodes  Respiratory: Normal respiratory effort. No  wheeze/rhonchi/rales  Cardiovascular: S1/S2 normal, no murmur/rub/gallop auscultated.  RRR.    ASSESSMENT/PLAN: Patient declines EKG at this time. Lung exam normal and overall improving except for nagging cough, I don't suspect respiratory pathology but concerning symptoms of occasional chest discomfort/dyspnea on exertion. Advised patient that we should do EKG today that she declines, given option for sending for stress test, she declines, will let me know if he starts to get worse. She states that follow-up better when she was taking her vitamin D, she like to restart vitamin D and see if the symptoms resolve. Questionable also whether due to deconditioning but patient advised that we cannot totally rule out cardiac abnormality at this point, if worse needs to be seen in the emergency room. If cough persists but does not get worse, fill steroids and inhaler, if getting worse return to clinic for chest x-ray/further evaluation. Advised that she is okay to go back to work if she feels able, possibility she is still contagious though of course likely less so than initial illness, would advise covering cough, frequent handwashing if she decides to go back to work  Viral URI with cough - Plan: fluticasone-salmeterol (ADVAIR HFA) 115-21 MCG/ACT inhaler, methylPREDNISolone (MEDROL DOSEPAK) 4 MG TBPK tablet, benzonatate (TESSALON) 200 MG capsule  Chest discomfort  Dyspnea, unspecified type    Visit summary was printed for the patient with medications and pertinent instructions for patient to review. ER/RTC precautions reviewed. All questions answered. No Follow-up on file.

## 2016-10-28 NOTE — Patient Instructions (Signed)
Note: the following list assumes no pregnancy, normal liver & kidney function and no other drug interactions. Dr. Hattie Aguinaldo has highlighted medications which are safe for you to use, but these may not be appropriate for everyone. Always ask a pharmacist or qualified medical provider if there are any questions!    Aches/Pains, Fever Acetaminophen (Tylenol) 500 mg tablets - take max 2 tablets (1000 mg) every 6 hours (4 times per day)  Ibuprofen (Motrin) 200 mg tablets - take max 4 tablets (800 mg) every 6 hours  Sinus Congestion Prescription Atrovent Cromolyn Nasal Spray (NasalCrom) 1 spray each nostril 3-4 times per day, max 6 imes per day Nasal Saline if desired Oxymetolazone (Afrin, others) sparing use due to rebound congestion Phenylephrine (Sudafed) 10 mg tablets every 4 hours (or the 12-hour formulation) Diphenhydramine (Benadryl) 25 mg tablets - take max 2 tablets every 4 hours  Cough & Sore Throat Prescription cough pills or syrups Dextromethorphan (Robitussin, others) - cough suppressant Guaifenesin (Robitussin, Mucinex, others) - expectorant (helps cough up mucus) (Dextromethorphan and Guaifenesin also come in a combination tablet) Lozenges w/ Benzocaine + Menthol (Cepacol) Honey - as much as you want! Teas which "coat the throat" - look for ingredients Elm Bark, Licorice Root, Marshmallow Root  Other Zinc Lozenges within 24 hours of symptoms onset - mixed evidence this shortens the duration of the common cold Don't waste your money on Vitamin C or Echinacea    

## 2016-11-01 ENCOUNTER — Telehealth: Payer: Self-pay

## 2016-11-01 DIAGNOSIS — Z0000 Encounter for general adult medical examination without abnormal findings: Secondary | ICD-10-CM

## 2016-11-01 DIAGNOSIS — R21 Rash and other nonspecific skin eruption: Secondary | ICD-10-CM

## 2016-11-01 DIAGNOSIS — Z0189 Encounter for other specified special examinations: Secondary | ICD-10-CM

## 2016-11-01 NOTE — Telephone Encounter (Signed)
Okay, lab orders are in. Can check for Lyme disease antibodies per patient request but I would rather see the rash and person before we do anything else about it.

## 2016-11-01 NOTE — Telephone Encounter (Signed)
Patient called stated that she was seen on 10/28/2016 and forgot to mention that she had a rash from a tick bite that she got in October and she stated that it is itching in that area. She is requesting or order for labs to have that checked as well since she is getting labs done. Please advise that is all the information that provided. Tryton Bodi,CMA

## 2016-11-01 NOTE — Telephone Encounter (Signed)
Patient notified and voiced understanding.

## 2016-11-02 ENCOUNTER — Ambulatory Visit (INDEPENDENT_AMBULATORY_CARE_PROVIDER_SITE_OTHER): Payer: BLUE CROSS/BLUE SHIELD | Admitting: Osteopathic Medicine

## 2016-11-02 ENCOUNTER — Encounter: Payer: Self-pay | Admitting: Osteopathic Medicine

## 2016-11-02 VITALS — BP 142/77 | HR 81 | Ht 61.0 in | Wt 205.0 lb

## 2016-11-02 DIAGNOSIS — R21 Rash and other nonspecific skin eruption: Secondary | ICD-10-CM

## 2016-11-02 DIAGNOSIS — L309 Dermatitis, unspecified: Secondary | ICD-10-CM | POA: Diagnosis not present

## 2016-11-02 LAB — COMPLETE METABOLIC PANEL WITH GFR
ALK PHOS: 55 U/L (ref 33–130)
ALT: 30 U/L — AB (ref 6–29)
AST: 22 U/L (ref 10–35)
Albumin: 4.1 g/dL (ref 3.6–5.1)
BUN: 14 mg/dL (ref 7–25)
CHLORIDE: 105 mmol/L (ref 98–110)
CO2: 28 mmol/L (ref 20–31)
CREATININE: 0.89 mg/dL (ref 0.50–1.05)
Calcium: 9.1 mg/dL (ref 8.6–10.4)
GFR, EST AFRICAN AMERICAN: 83 mL/min (ref >=60)
GFR, Est Non African American: 72 mL/min (ref >=60)
Glucose, Bld: 111 mg/dL — ABNORMAL HIGH (ref 65–99)
POTASSIUM: 4.3 mmol/L (ref 3.5–5.3)
Sodium: 140 mmol/L (ref 135–146)
Total Bilirubin: 0.5 mg/dL (ref 0.2–1.2)
Total Protein: 6.8 g/dL (ref 6.1–8.1)

## 2016-11-02 LAB — LIPID PANEL
CHOLESTEROL: 180 mg/dL (ref <200)
HDL: 43 mg/dL — ABNORMAL LOW (ref >50)
LDL Cholesterol: 82 mg/dL (ref <100)
Total CHOL/HDL Ratio: 4.2 Ratio (ref <5.0)
Triglycerides: 275 mg/dL — ABNORMAL HIGH (ref <150)
VLDL: 55 mg/dL — AB (ref <30)

## 2016-11-02 LAB — CBC WITH DIFFERENTIAL/PLATELET
Basophils Absolute: 44 cells/uL (ref 0–200)
Basophils Relative: 1 %
EOS ABS: 176 {cells}/uL (ref 15–500)
Eosinophils Relative: 4 %
HCT: 41.7 % (ref 35.0–45.0)
HEMOGLOBIN: 13.7 g/dL (ref 11.7–15.5)
LYMPHS ABS: 1276 {cells}/uL (ref 850–3900)
LYMPHS PCT: 29 %
MCH: 28.5 pg (ref 27.0–33.0)
MCHC: 32.9 g/dL (ref 32.0–36.0)
MCV: 86.7 fL (ref 80.0–100.0)
MONO ABS: 440 {cells}/uL (ref 200–950)
MPV: 10.2 fL (ref 7.5–12.5)
Monocytes Relative: 10 %
Neutro Abs: 2464 cells/uL (ref 1500–7800)
Neutrophils Relative %: 56 %
Platelets: 422 10*3/uL — ABNORMAL HIGH (ref 140–400)
RBC: 4.81 MIL/uL (ref 3.80–5.10)
RDW: 13.6 % (ref 11.0–15.0)
WBC: 4.4 10*3/uL (ref 3.8–10.8)

## 2016-11-02 LAB — TSH: TSH: 2.47 mIU/L

## 2016-11-02 MED ORDER — METHYLPREDNISOLONE 4 MG PO TBPK: ORAL_TABLET | ORAL | 0 refills | Status: DC

## 2016-11-02 MED ORDER — HYDROXYZINE HCL 25 MG PO TABS: 25.0000 mg | ORAL_TABLET | Freq: Four times a day (QID) | ORAL | 1 refills | Status: AC | PRN

## 2016-11-02 MED ORDER — HYDROCORTISONE 2.5 % EX CREA: TOPICAL_CREAM | Freq: Two times a day (BID) | CUTANEOUS | 1 refills | Status: AC

## 2016-11-02 NOTE — Progress Notes (Signed)
HPI: Madison Sweeney is a 61 y.o. female  who [facility] to [facility] today, 11/02/16,  for chief complaint of:  Chief Complaint  Patient presents with  . Rash    Rash: . Location:Chest, back, thighs . Context: Not sure if might have something to do with recent illness (upper respiratory infection/chest pain type syndrome, patient states the symptoms have largely resolved with exception of some minimal cough), has animals but they're up-to-date on flea treatment, no change in lotion/detergent/other possible skin irritation . Quality: Itching spots, no ulceration/drainage . Duration: 5 days, started initially maybe 10 days ago . Modifying factors: No over-the-counter medications tried  Eczema: Patient states steroid medication was helpful but requests cream rather than ointment   Past medical, surgical, social and family history reviewed: Patient Active Problem List   Diagnosis Date Noted  . Hepatitis C antibody test positive 12/23/2015  . Hyperlipidemia 12/23/2015  . Hypovitaminosis D 12/23/2015  . Influenza vaccination declined 12/23/2015  . Chronic fatigue 12/10/2015  . Chest discomfort 12/10/2015  . Elevated blood pressure 12/10/2015  . Rash and nonspecific skin eruption 12/10/2015   Past Surgical History:  Procedure Laterality Date  . BREAST ENHANCEMENT SURGERY    . TUBAL LIGATION     Social History  Substance Use Topics  . Smoking status: Never Smoker  . Smokeless tobacco: Not on file  . Alcohol use No   Family History  Problem Relation Age of Onset  . Heart attack Mother     DECEASED  . Heart attack Maternal Grandfather   . Diabetes Father     DECEASED     Current medication list and allergy/intolerance information reviewed:   Current Outpatient Prescriptions on File Prior to Visit  Medication Sig Dispense Refill  . B Complex-C-E-Zn (B COMPLEX-C-E-ZINC) tablet Take 1 tablet by mouth daily. Reported on 12/22/2015    .  Cholecalciferol (VITAMIN D3) 50000 units TABS Take 1 tablet by mouth every 7 (seven) days. 8 tablet 0  . fluticasone-salmeterol (ADVAIR HFA) 115-21 MCG/ACT inhaler Inhale 2 puffs into the lungs 2 (two) times daily. 1 Inhaler 0  . hydrocortisone 2.5 % ointment Apply to affected area BID. 20 g 3   No current facility-administered medications on file prior to visit.    Allergies  Allergen Reactions  . Erythromycin Anaphylaxis  . Peach Flavor Rash      Review of Systems:  Constitutional: +recent illness improved  Cardiac: No  chest pain, No  pressure  Respiratory:  No  shortness of breath. No  Cough  Gastrointestinal: No  abdominal pain, no change on bowel habits  Musculoskeletal: No new myalgia/arthralgia  Skin: +Rash  Neurologic: No  weakness, No  Dizziness  Exam:  BP (!) 142/77   Pulse 81   Ht 5\' 1"  (1.549 m)   Wt 205 lb (93 kg)   BMI 38.73 kg/m   Constitutional: VS see above. General Appearance: alert, well-developed, well-nourished, NAD  Eyes: Normal lids and conjunctive, non-icteric sclera  Ears, Nose, Mouth, Throat: MMM, Normal external inspection ears/nares/mouth/lips/gums.  Neck: No masses, trachea midline.   Respiratory: Normal respiratory effort. no wheeze, no rhonchi, no rales  Cardiovascular: S1/S2 normal, no murmur, no rub/gallop auscultated. RRR.   Musculoskeletal: Gait normal. Symmetric and independent movement of all extremities  Neurological: Normal balance/coordination. No tremor.  Skin: warm, dry, intact. Hives/urticaria on chest, blanching, no scaling/drainage/ulceration. Skin appears irritated but not scaling.  Psychiatric: Normal judgment/insight. Normal mood and affect. Oriented x3.  ASSESSMENT/PLAN: Hives/allergic urticaria, possible contact irritant versus drug rash as well. If no improvement will biopsy, reassuring that patient states overall it is better than it was, worst was about 3-4 days ago.  Rash and nonspecific skin  eruption  Eczema, unspecified type    Patient Instructions  I think this rash is likely due to allergic reaction - will treat itching with Hydroxyzine and if the rash persists you can fill prescription for steroids after 1-3 days. If rash persists >1 week or if it gets worse, we should do a biopsy in the office, please let me know if rash no better after a week or if it gets worse.     Visit summary with medication list and pertinent instructions was printed for patient to review. All questions at time of visit were answered - patient instructed to contact office with any additional concerns. ER/RTC precautions were reviewed with the patient. Follow-up plan: No Follow-up on file.

## 2016-11-02 NOTE — Patient Instructions (Signed)
I think this rash is likely due to allergic reaction - will treat itching with Hydroxyzine and if the rash persists you can fill prescription for steroids after 1-3 days. If rash persists >1 week or if it gets worse, we should do a biopsy in the office, please let me know if rash no better after a week or if it gets worse.

## 2016-11-03 LAB — VITAMIN D 25 HYDROXY (VIT D DEFICIENCY, FRACTURES): VIT D 25 HYDROXY: 43 ng/mL (ref 30–100)

## 2016-12-24 ENCOUNTER — Telehealth: Payer: Self-pay

## 2016-12-24 DIAGNOSIS — Z0189 Encounter for other specified special examinations: Secondary | ICD-10-CM

## 2016-12-24 NOTE — Telephone Encounter (Signed)
Orders re printed

## 2016-12-27 LAB — LYME AB/WESTERN BLOT REFLEX: B burgdorferi Ab IgG+IgM: <0.9 Index (ref <0.90)

## 2018-01-12 ENCOUNTER — Telehealth: Payer: Self-pay

## 2018-01-12 DIAGNOSIS — Z0000 Encounter for general adult medical examination without abnormal findings: Secondary | ICD-10-CM

## 2018-01-12 DIAGNOSIS — E559 Vitamin D deficiency, unspecified: Secondary | ICD-10-CM

## 2018-01-12 DIAGNOSIS — E7849 Other hyperlipidemia: Secondary | ICD-10-CM

## 2018-01-12 NOTE — Telephone Encounter (Signed)
Pt called requesting a lab order to complete before coming in for her yrly exam. Can you please place an order in advance? Thanks.

## 2018-01-12 NOTE — Telephone Encounter (Signed)
I entered labs but didn't sign. Wasn't sure if for a CPE or regular visit.  If CPE, then can use wellness code.

## 2018-01-13 NOTE — Telephone Encounter (Signed)
Left a detailed vm msg for pt regarding lab order. Pt informed labs must be done fasting. Call back information provided.

## 2018-02-09 DIAGNOSIS — Z0000 Encounter for general adult medical examination without abnormal findings: Secondary | ICD-10-CM | POA: Diagnosis not present

## 2018-02-09 DIAGNOSIS — E559 Vitamin D deficiency, unspecified: Secondary | ICD-10-CM | POA: Diagnosis not present

## 2018-02-09 DIAGNOSIS — E7849 Other hyperlipidemia: Secondary | ICD-10-CM | POA: Diagnosis not present

## 2018-02-09 LAB — COMPLETE METABOLIC PANEL WITH GFR
AG Ratio: 1.6 (calc) (ref 1.0–2.5)
ALT: 14 U/L (ref 6–29)
AST: 16 U/L (ref 10–35)
Albumin: 4.3 g/dL (ref 3.6–5.1)
Alkaline phosphatase (APISO): 66 U/L (ref 33–130)
BUN: 16 mg/dL (ref 7–25)
CALCIUM: 9.4 mg/dL (ref 8.6–10.4)
CO2: 28 mmol/L (ref 20–32)
Chloride: 106 mmol/L (ref 98–110)
Creat: 0.84 mg/dL (ref 0.50–1.05)
GFR, EST NON AFRICAN AMERICAN: 76 mL/min/{1.73_m2} (ref >=60)
GFR, Est African American: 88 mL/min/{1.73_m2} (ref >=60)
Globulin: 2.7 g/dL (calc) (ref 1.9–3.7)
Glucose, Bld: 96 mg/dL (ref 65–99)
Potassium: 4.3 mmol/L (ref 3.5–5.3)
Sodium: 140 mmol/L (ref 135–146)
Total Bilirubin: 0.6 mg/dL (ref 0.2–1.2)
Total Protein: 7 g/dL (ref 6.1–8.1)

## 2018-02-09 LAB — LIPID PANEL
Cholesterol: 260 mg/dL — ABNORMAL HIGH (ref <200)
HDL: 67 mg/dL (ref >50)
LDL Cholesterol (Calc): 169 mg/dL (calc) — ABNORMAL HIGH
NON-HDL CHOLESTEROL (CALC): 193 mg/dL — AB (ref <130)
Total CHOL/HDL Ratio: 3.9 (calc) (ref <5.0)
Triglycerides: 116 mg/dL (ref <150)

## 2018-02-09 LAB — TSH: TSH: 3.43 mIU/L (ref 0.40–4.50)

## 2018-02-10 ENCOUNTER — Other Ambulatory Visit: Payer: Self-pay | Admitting: Family Medicine

## 2018-02-10 LAB — VITAMIN D 25 HYDROXY (VIT D DEFICIENCY, FRACTURES): VIT D 25 HYDROXY: 43 ng/mL (ref 30–100)

## 2018-11-17 ENCOUNTER — Telehealth: Payer: Self-pay | Admitting: Osteopathic Medicine

## 2018-11-17 DIAGNOSIS — Z0000 Encounter for general adult medical examination without abnormal findings: Secondary | ICD-10-CM

## 2018-11-17 DIAGNOSIS — E7849 Other hyperlipidemia: Secondary | ICD-10-CM

## 2018-11-17 DIAGNOSIS — E559 Vitamin D deficiency, unspecified: Secondary | ICD-10-CM

## 2018-11-17 LAB — TSH: TSH: 2.42 mIU/L (ref 0.40–4.50)

## 2018-11-17 NOTE — Telephone Encounter (Signed)
No. I don't have openings today for an annual and patient didn't follow up after last labs were ordered pre-physical, so she cannot just walk in here and request labs if we cannot be confident patient will follow up appropriately.   Can get TSH for medication safety and that's it - she isn't due until April though so insurance may not pay for it anyway

## 2018-11-17 NOTE — Telephone Encounter (Signed)
Pt got fired from her job yesterday, requesting lab orders since today is her last day with insurance. Routing.   Last year we ordered labs and there was never an OV appt made.

## 2018-11-17 NOTE — Telephone Encounter (Signed)
Pt advised.

## 2019-08-31 DIAGNOSIS — R5383 Other fatigue: Secondary | ICD-10-CM | POA: Diagnosis not present

## 2019-08-31 DIAGNOSIS — K219 Gastro-esophageal reflux disease without esophagitis: Secondary | ICD-10-CM | POA: Diagnosis not present

## 2019-08-31 DIAGNOSIS — L658 Other specified nonscarring hair loss: Secondary | ICD-10-CM | POA: Diagnosis not present

## 2019-08-31 DIAGNOSIS — B379 Candidiasis, unspecified: Secondary | ICD-10-CM | POA: Diagnosis not present
